# Patient Record
Sex: Female | Born: 2005 | Race: White | Hispanic: No | Marital: Single | Smoking: Never smoker
Health system: Southern US, Community
[De-identification: ages and names within clinical notes are randomized; demographics above are authoritative.]

---
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
## 2023-10-23 ENCOUNTER — Emergency Department
Admission: EM | Admit: 2023-10-23 | Discharge: 2023-10-23 | Disposition: A | Discharge status: Home / Self Care | Payer: Self-pay | Attending: Emergency Medicine | Admitting: Emergency Medicine

## 2023-10-23 ENCOUNTER — Other Ambulatory Visit: Payer: Self-pay

## 2023-10-23 DIAGNOSIS — J029 Acute pharyngitis, unspecified: Secondary | ICD-10-CM | POA: Insufficient documentation

## 2023-10-23 DIAGNOSIS — R051 Acute cough: Secondary | ICD-10-CM

## 2023-10-23 LAB — RESP PANEL BY RT-PCR (RSV, FLU A&B, COVID)  RVPGX2
Influenza A by PCR: NEGATIVE
Influenza B by PCR: NEGATIVE
Resp Syncytial Virus by PCR: NEGATIVE
SARS Coronavirus 2 by RT PCR: NEGATIVE

## 2023-10-23 LAB — GROUP A STREP BY PCR: Group A Strep by PCR: NOT DETECTED

## 2023-10-23 LAB — MONONUCLEOSIS SCREEN: Mono Screen: NEGATIVE

## 2023-10-23 MED ORDER — PSEUDOEPH-BROMPHEN-DM 30-2-10 MG/5ML PO SYRP: 5.0000 mL | ORAL_SOLUTION | Freq: Four times a day (QID) | ORAL | 0 refills | Status: DC | PRN

## 2023-10-23 MED ORDER — IBUPROFEN 600 MG PO TABS: 600.0000 mg | ORAL_TABLET | Freq: Three times a day (TID) | ORAL | 0 refills | Status: AC | PRN

## 2023-10-23 NOTE — ED Provider Notes (Signed)
 [facility] Provider Note    Event Date/Time   First MD Initiated Contact with Patient 10/23/23 1859     (approximate)   History   Sore Throat and Cough    HPI  Jill Rangel is a 18 y.o. female   with no significant past medical history who presents to the ED complaining of sore throat, odynophagia, cough.  According to the patient symptoms started 7 days ago.  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       Physical Exam   Triage Vital Signs: ED Triage Vitals  Encounter Vitals Group     BP 10/23/23 1835 (!) 139/91     Systolic BP Percentile --      Diastolic BP Percentile --      Pulse Rate 10/23/23 1835 (!) 117     Resp 10/23/23 1835 18     Temp 10/23/23 1835 98.9 F (37.2 C)     Temp src --      SpO2 10/23/23 1835 97 %     Weight --      Height --      Head Circumference --      Peak Flow --      Pain Score 10/23/23 1834 7     Pain Loc --      Pain Education --      Exclude from Growth Chart --     Most recent vital signs: Vitals:   10/23/23 1835  BP: (!) 139/91  Pulse: (!) 117  Resp: 18  Temp: 98.9 F (37.2 C)  SpO2: 97%     Constitutional: Alert, NAD. Able to speak in complete sentences without cough or dyspnea  Eyes: Conjunctivae are normal.  Head: Atraumatic. Nose: No congestion/rhinnorhea. Mouth/Throat: Mucous membranes are moist.  Presence of papules in the back of the throat. Neck: Painless ROM. Supple. No JVD, nodes, thyromegaly.  Tender to palpation in the right occipital area.  No presence of nodules. Cardiovascular:   Good peripheral circulation.RRR no murmurs, gallops, rubs  Respiratory: Normal respiratory effort.  No retractions. Clear to auscultation bilaterally without wheezing or crackles  Gastrointestinal: Soft and nontender.  Musculoskeletal:  no deformity Neurologic:  MAE spontaneously. No gross focal neurologic deficits are appreciated.  Skin:  Skin is warm, dry and intact. No rash  noted. Psychiatric: Mood and affect are normal. Speech and behavior are normal.    ED Results / Procedures / Treatments   Labs (all labs ordered are listed, but only abnormal results are displayed) Labs Reviewed  GROUP A STREP BY PCR  RESP PANEL BY RT-PCR (RSV, FLU A&B, COVID)  [id number]  MONONUCLEOSIS SCREEN     EKG     RADIOLOGY     PROCEDURES:  Critical Care performed:   Procedures   MEDICATIONS ORDERED IN ED: Medications - No data to display Clinical Course as of 10/23/23 2258  Sun Oct 23, 2023  1935 Group A Strep by PCR Negative  [AE]  2040 Resp panel by RT-PCR (RSV, Flu A&B, Covid) Anterior Nasal Swab Negative [AE]  2251 Mononucleosis screen Negative [AE]    Clinical Course User Index [AE] Awilda Lennox, PA-C    IMPRESSION / MDM / ASSESSMENT AND PLAN / ED COURSE  I reviewed the triage vital signs and the nursing notes.  Differential diagnosis includes, but is not limited to, viral upper respiratory infection, strep, COVID, mononucleosis  Patient's presentation is most consistent with acute complicated illness / injury requiring diagnostic workup.  Patient's diagnosis is consistent with viral upper respiratory infection.  Labs are  reassuring ruling out strep, RSV, flu, COVID, mononucleosis. I did review the patient's allergies and medications.The patient is in stable and satisfactory condition for discharge home  Patient will be discharged home with prescriptions for . Patient is to follow up with PCP as needed or otherwise directed. Patient is given ED precautions to return to the ED for any worsening or new symptoms. Discussed plan of care with patient, answered all of patient's questions, Patient agreeable to plan of care. Advised patient to take medications according to the instructions on the label. Discussed possible side effects of new medications. Patient verbalized understanding.    FINAL CLINICAL IMPRESSION(S) / ED DIAGNOSES   Final  diagnoses:  Acute cough  Acute pharyngitis, unspecified etiology     Rx / DC Orders   ED Discharge Orders          Ordered    brompheniramine-pseudoephedrine-DM 30-2-10 MG/5ML syrup  4 times daily PRN        10/23/23 2258    ibuprofen (ADVIL) 600 MG tablet  Every 8 hours PRN        10/23/23 2258             Note:  This document was prepared using Dragon voice recognition software and may include unintentional dictation errors.   Awilda Lennox, PA-C 10/23/23 2258    Collis Deaner, MD 10/24/23 [phone]

## 2023-10-23 NOTE — Discharge Instructions (Signed)
 You have been diagnosed with a viral upper respiratory infection.  Please take brompheniramine and syrup for cough take 5 mL by mouth 4 times daily as needed.  Please take ibuprofen every 8 hours after main meals.  Drink plenty fluids.  Please come back to ED or go to your PCP if you have new symptoms or symptoms worsen.

## 2023-10-23 NOTE — ED Notes (Signed)
 Written and verbal discharge instructions reviewed with patient, understanding verbalized, denied questions. Discharged from unit ambulatory in good condition with family member.

## 2023-10-23 NOTE — ED Triage Notes (Signed)
 Pt comes wiht cough and sore throat. Pt states cough started Monday and started to lose her voice Wed night.

## 2023-11-01 ENCOUNTER — Emergency Department
Admission: EM | Admit: 2023-11-01 | Discharge: 2023-11-01 | Disposition: A | Discharge status: Home / Self Care | Payer: Self-pay | Attending: Emergency Medicine | Admitting: Emergency Medicine

## 2023-11-01 ENCOUNTER — Encounter: Payer: Self-pay | Admitting: Emergency Medicine

## 2023-11-01 ENCOUNTER — Other Ambulatory Visit: Payer: Self-pay

## 2023-11-01 DIAGNOSIS — J029 Acute pharyngitis, unspecified: Secondary | ICD-10-CM | POA: Insufficient documentation

## 2023-11-01 MED ORDER — PREDNISONE 20 MG PO TABS
60.0000 mg | ORAL_TABLET | Freq: Once | ORAL | Status: AC
  Administered 2023-11-01: 60 mg via ORAL
  Filled 2023-11-01: qty 3

## 2023-11-01 MED ORDER — PREDNISONE 50 MG PO TABS: 50.0000 mg | ORAL_TABLET | Freq: Every day | ORAL | 0 refills | Status: AC

## 2023-11-01 MED ORDER — PSEUDOEPH-BROMPHEN-DM 30-2-10 MG/5ML PO SYRP: 5.0000 mL | ORAL_SOLUTION | Freq: Four times a day (QID) | ORAL | 0 refills | Status: AC | PRN

## 2023-11-01 MED ORDER — AMOXICILLIN 500 MG PO CAPS: 500.0000 mg | ORAL_CAPSULE | Freq: Three times a day (TID) | ORAL | 0 refills | Status: AC

## 2023-11-01 NOTE — ED Triage Notes (Signed)
 Patient to ED via POV for mouth pain. States she is having a sore tongue that has now moved into jaw. Started Sunday. No swelling noted.

## 2023-11-01 NOTE — Discharge Instructions (Addendum)
 Been diagnosed with pharyngitis.  Please take amoxicillin  1 capsule by mouth every 8 hours after main meals for 7 days.  Please take 0 cough every 6 hours as needed.  Please take prednisone  1 tablet with breakfast.  Please drink plenty of fluids.  Please come back to ED or go [facility] if you have new symptoms or symptoms worsen.

## 2023-11-01 NOTE — ED Provider Notes (Signed)
 [facility] Provider Note    Event Date/Time   First MD Initiated Contact with Patient 11/01/23 1921     (approximate)   History   Oral Pain    HPI  Jill Rangel is a 18 y.o. female with no significant past medical history who presents to the ED complaining of sore throat, odynophagia, wet cough.   According to the patient, she works at daycare, sick contacts at work.  Patient states pain is radiating to the left ear.  Patient denies fever, chills.       Physical Exam   Triage Vital Signs: ED Triage Vitals  Encounter Vitals Group     BP 11/01/23 1841 (!) 151/100     Systolic BP Percentile --      Diastolic BP Percentile --      Pulse Rate 11/01/23 1841 (!) 112     Resp 11/01/23 1841 17     Temp 11/01/23 1841 98.7 F (37.1 C)     Temp Source 11/01/23 1841 Oral     SpO2 11/01/23 1841 100 %     Weight 11/01/23 1842 198 lb (89.8 kg)     Height 11/01/23 1842 5\' 2"  (1.575 m)     Head Circumference --      Peak Flow --      Pain Score 11/01/23 1842 7     Pain Loc --      Pain Education --      Exclude from Growth Chart --     Most recent vital signs: Vitals:   11/01/23 1841  BP: (!) 151/100  Pulse: (!) 112  Resp: 17  Temp: 98.7 F (37.1 C)  SpO2: 100%     Constitutional: Alert, NAD. Able to speak in complete sentences without cough or dyspnea  Eyes: Conjunctivae are normal.  Head: Atraumatic. Nose: No congestion/rhinnorhea. Left ear: Tympanic membrane is  intact, [place] tympanic erythema Mouth/Throat: Mucous membranes are moist.  Peritonsillar erythema, vesicles.  Hypertrophic tonsil Neck: Painless ROM. Supple. No JVD, nodes, thyromegaly.  Tender to palpation in submandibular area Cardiovascular:   Good peripheral circulation.RRR no murmurs, gallops, rubs  Respiratory: Normal respiratory effort.  No retractions. Clear to auscultation bilaterally without wheezing or crackles  Gastrointestinal: Soft and nontender.   Musculoskeletal:  no deformity Neurologic:  MAE spontaneously. No gross focal neurologic deficits are appreciated.  Skin:  Skin is warm, dry and intact. No rash noted. Psychiatric: Mood and affect are normal. Speech and behavior are normal.    ED Results / Procedures / Treatments   Labs (all labs ordered are listed, but only abnormal results are displayed) Labs Reviewed - No data to display   EKG     RADIOLOGY     PROCEDURES:  Critical Care performed:   Procedures   MEDICATIONS ORDERED IN ED: Medications  predniSONE  (DELTASONE ) tablet 60 mg (has no administration in time range)      IMPRESSION / MDM / ASSESSMENT AND PLAN / ED COURSE  I reviewed the triage vital signs and the nursing notes.  Differential diagnosis includes, but is not limited to pharyngitis, viral upper respiratory infection, strep  Patient's presentation is most consistent with acute, uncomplicated illness.   Patient's diagnosis is consistent with pharyngitis.  Physical exam is reassuring I did review the patient's allergies and medications.During admission patient received prednisone .  The patient is in stable and satisfactory condition for discharge home  Patient will be discharged home with prescriptions for prednisone , amoxicillin ,cough syrup,. Patient is  to [facility] as needed or otherwise directed. Patient is given ED precautions to return to the ED for any worsening or new symptoms. Discussed plan of care with patient, answered all of patient's questions, Patient agreeable to plan of care. Advised patient to take medications according to the instructions on the label. Discussed possible side effects of new medications. Patient verbalized understanding.    FINAL CLINICAL IMPRESSION(S) / ED DIAGNOSES   Final diagnoses:  Pharyngitis, unspecified etiology     Rx / DC Orders   ED Discharge Orders          Ordered    predniSONE  (DELTASONE ) 50 MG tablet  Daily with  breakfast        11/01/23 2051    brompheniramine-pseudoephedrine-DM 30-2-10 MG/5ML syrup  4 times daily PRN        11/01/23 2051    amoxicillin  (AMOXIL ) 500 MG capsule  3 times daily        11/01/23 2051             Note:  This document was prepared using Dragon voice recognition software and may include unintentional dictation errors.   Awilda Lennox, PA-C 11/01/23 2053    Lubertha Rush, MD 11/02/23 2133
